# Patient Record
Sex: Female | Born: 1980 | Race: Black or African American | Hispanic: No | Marital: Single
Health system: Southern US, Community
[De-identification: ages and names within clinical notes are randomized; demographics above are authoritative.]

## PROBLEM LIST (undated history)

## (undated) DIAGNOSIS — J45909 Unspecified asthma, uncomplicated: Secondary | ICD-10-CM

## (undated) DIAGNOSIS — I10 Essential (primary) hypertension: Secondary | ICD-10-CM

## (undated) HISTORY — PX: ABDOMINAL SURGERY: SHX537

---

## 2018-02-01 ENCOUNTER — Emergency Department (HOSPITAL_COMMUNITY): Payer: Self-pay

## 2018-02-01 ENCOUNTER — Other Ambulatory Visit: Payer: Self-pay

## 2018-02-01 ENCOUNTER — Encounter (HOSPITAL_COMMUNITY): Payer: Self-pay

## 2018-02-01 ENCOUNTER — Emergency Department (HOSPITAL_COMMUNITY)
Admission: EM | Admit: 2018-02-01 | Discharge: 2018-02-01 | Disposition: A | Discharge status: Home / Self Care | Payer: Self-pay | Attending: Emergency Medicine | Admitting: Emergency Medicine

## 2018-02-01 DIAGNOSIS — I10 Essential (primary) hypertension: Secondary | ICD-10-CM | POA: Insufficient documentation

## 2018-02-01 DIAGNOSIS — G43809 Other migraine, not intractable, without status migrainosus: Secondary | ICD-10-CM | POA: Insufficient documentation

## 2018-02-01 DIAGNOSIS — R200 Anesthesia of skin: Secondary | ICD-10-CM | POA: Insufficient documentation

## 2018-02-01 DIAGNOSIS — R42 Dizziness and giddiness: Secondary | ICD-10-CM | POA: Insufficient documentation

## 2018-02-01 HISTORY — DX: Essential (primary) hypertension: I10

## 2018-02-01 HISTORY — DX: Unspecified asthma, uncomplicated: J45.909

## 2018-02-01 LAB — COMPREHENSIVE METABOLIC PANEL
ALT: 11 U/L (ref 0–44)
AST: 18 U/L (ref 15–41)
Albumin: 3.3 g/dL — ABNORMAL LOW (ref 3.5–5.0)
Alkaline Phosphatase: 75 U/L (ref 38–126)
Anion gap: 10 (ref 5–15)
BUN: 11 mg/dL (ref 6–20)
CO2: 21 mmol/L — AB (ref 22–32)
Calcium: 8.8 mg/dL — ABNORMAL LOW (ref 8.9–10.3)
Chloride: 107 mmol/L (ref 98–111)
Creatinine, Ser: 0.76 mg/dL (ref 0.44–1.00)
GFR calc Af Amer: >60 mL/min (ref >60)
GFR calc non Af Amer: >60 mL/min (ref >60)
Glucose, Bld: 124 mg/dL — ABNORMAL HIGH (ref 70–99)
POTASSIUM: 3.4 mmol/L — AB (ref 3.5–5.1)
Sodium: 138 mmol/L (ref 135–145)
Total Bilirubin: 0.4 mg/dL (ref 0.3–1.2)
Total Protein: 7.1 g/dL (ref 6.5–8.1)

## 2018-02-01 LAB — DIFFERENTIAL
ABS IMMATURE GRANULOCYTES: 0.03 10*3/uL (ref 0.00–0.07)
Basophils Absolute: 0.1 10*3/uL (ref 0.0–0.1)
Basophils Relative: 1 %
Eosinophils Absolute: 0.4 10*3/uL (ref 0.0–0.5)
Eosinophils Relative: 5 %
IMMATURE GRANULOCYTES: 0 %
Lymphocytes Relative: 41 %
Lymphs Abs: 3.4 10*3/uL (ref 0.7–4.0)
Monocytes Absolute: 0.4 10*3/uL (ref 0.1–1.0)
Monocytes Relative: 5 %
Neutro Abs: 4 10*3/uL (ref 1.7–7.7)
Neutrophils Relative %: 48 %

## 2018-02-01 LAB — CBC
HCT: 37.1 % (ref 36.0–46.0)
Hemoglobin: 11.4 g/dL — ABNORMAL LOW (ref 12.0–15.0)
MCH: 27.7 pg (ref 26.0–34.0)
MCHC: 30.7 g/dL (ref 30.0–36.0)
MCV: 90 fL (ref 80.0–100.0)
NRBC: 0 % (ref 0.0–0.2)
Platelets: 295 10*3/uL (ref 150–400)
RBC: 4.12 MIL/uL (ref 3.87–5.11)
RDW: 15.1 % (ref 11.5–15.5)
WBC: 8.3 10*3/uL (ref 4.0–10.5)

## 2018-02-01 LAB — I-STAT BETA HCG BLOOD, ED (MC, WL, AP ONLY)

## 2018-02-01 LAB — PROTIME-INR
INR: 0.95
Prothrombin Time: 12.6 seconds (ref 11.4–15.2)

## 2018-02-01 LAB — APTT: aPTT: 32 seconds (ref 24–36)

## 2018-02-01 LAB — I-STAT TROPONIN, ED: Troponin i, poc: 0 ng/mL (ref 0.00–0.08)

## 2018-02-01 LAB — CBG MONITORING, ED: Glucose-Capillary: 117 mg/dL — ABNORMAL HIGH (ref 70–99)

## 2018-02-01 MED ORDER — DEXAMETHASONE SODIUM PHOSPHATE 10 MG/ML IJ SOLN
10.0000 mg | Freq: Once | INTRAMUSCULAR | Status: AC
  Administered 2018-02-01: 10 mg via INTRAVENOUS
  Filled 2018-02-01: qty 1

## 2018-02-01 MED ORDER — DIPHENHYDRAMINE HCL 25 MG PO CAPS
25.0000 mg | ORAL_CAPSULE | Freq: Once | ORAL | Status: AC
  Administered 2018-02-01: 25 mg via ORAL
  Filled 2018-02-01: qty 1

## 2018-02-01 MED ORDER — LACTATED RINGERS IV BOLUS
1000.0000 mL | Freq: Once | INTRAVENOUS | Status: AC
  Administered 2018-02-01: 1000 mL via INTRAVENOUS

## 2018-02-01 MED ORDER — PROCHLORPERAZINE EDISYLATE 10 MG/2ML IJ SOLN
10.0000 mg | Freq: Once | INTRAMUSCULAR | Status: AC
  Administered 2018-02-01: 10 mg via INTRAVENOUS
  Filled 2018-02-01: qty 2

## 2018-02-01 NOTE — ED Notes (Signed)
E-signature not available, pt verbalized understanding of DC instructions  

## 2018-02-01 NOTE — ED Triage Notes (Signed)
Pt c.o headache, left sided tingling and chest pain around 5pm yesterday. Pt endorsing blurred vision. Denies CP at this time.

## 2018-02-01 NOTE — ED Provider Notes (Signed)
[facility] Provider Note   CSN: [id number] Arrival date & time: 02/01/18  1829     History   Chief Complaint Chief Complaint  Patient presents with  . Headache    HPI Susan Richards is a 37 y.o. female.  The history is provided by the patient.  Headache   This is a new problem. The current episode started yesterday. The problem occurs hourly. The problem has been gradually worsening. The headache is associated with emotional stress (poor sleep). The pain is located in the left unilateral region. The quality of the pain is described as dull. The pain is at a severity of 7/10. The pain is moderate. The pain radiates to the left arm. Associated symptoms include chest pressure (briefly last night but resolve, no chest pain now). Pertinent negatives include no anorexia, no fever, no malaise/fatigue, no near-syncope, no orthopnea, no palpitations, no syncope, no shortness of breath, no nausea and no vomiting. She has tried NSAIDs for the symptoms. The treatment provided mild relief.    Past Medical History:  Diagnosis Date  . Asthma   . Hypertension     There are no active problems to display for this patient.   Past Surgical History:  Procedure Laterality Date  . ABDOMINAL SURGERY    . CESAREAN SECTION  2006     OB History   No obstetric history on file.      Home Medications    Prior to Admission medications   Medication Sig Start Date End Date Taking? Authorizing Provider  ibuprofen (ADVIL,MOTRIN) 200 MG tablet Take 800 mg by mouth every 6 (six) hours as needed for headache or mild pain.   Yes [provider]    Family History No family history on file.  Social History Social History   Tobacco Use  . Smoking status: Not on file  Substance Use Topics  . Alcohol use: Not on file  . Drug use: Not on file     Allergies   Chocolate   Review of Systems Review of Systems  Constitutional: Negative for  chills, fever and malaise/fatigue.  HENT: Negative for ear pain and sore throat.   Eyes: Negative for photophobia, pain, discharge, redness, itching and visual disturbance.  Respiratory: Negative for cough and shortness of breath.   Cardiovascular: Negative for chest pain, palpitations, orthopnea, syncope and near-syncope.  Gastrointestinal: Negative for abdominal pain, anorexia, nausea and vomiting.  Genitourinary: Negative for dysuria and hematuria.  Musculoskeletal: Negative for arthralgias and back pain.  Skin: Negative for color change and rash.  Neurological: Positive for dizziness, numbness and headaches. Negative for tremors, seizures, syncope, speech difficulty, weakness and light-headedness.  All other systems reviewed and are negative.    Physical Exam Updated Vital Signs  ED Triage Vitals  Enc Vitals Group     BP 02/01/18 1844 (!) 181/99     Pulse Rate 02/01/18 1844 75     Resp 02/01/18 1844 16     Temp 02/01/18 1844 98.3 F (36.8 C)     Temp Source 02/01/18 1844 Oral     SpO2 02/01/18 1844 97 %     Weight --      Height --      Head Circumference --      Peak Flow --      Pain Score 02/01/18 1850 10     Pain Loc --      Pain Edu? --      Excl. in GC? --  Physical Exam Vitals signs and nursing note reviewed.  Constitutional:      General: She is not in acute distress.    Appearance: She is well-developed.  HENT:     Head: Normocephalic and atraumatic.     Mouth/Throat:     Mouth: Mucous membranes are moist.     Pharynx: Oropharynx is clear.  Eyes:     General: No visual field deficit.    Extraocular Movements: Extraocular movements intact.     Right eye: Normal extraocular motion and no nystagmus.     Left eye: Normal extraocular motion and no nystagmus.     Conjunctiva/sclera: Conjunctivae normal.     Pupils: Pupils are equal, round, and reactive to light.     Right eye: Pupil is round and reactive.     Left eye: Pupil is round and reactive.    Neck:     Musculoskeletal: Normal range of motion and neck supple. No neck rigidity.     Meningeal: Brudzinski's sign absent.  Cardiovascular:     Rate and Rhythm: Normal rate and regular rhythm.     Heart sounds: Normal heart sounds. No murmur.  Pulmonary:     Effort: Pulmonary effort is normal. No respiratory distress.     Breath sounds: Normal breath sounds.  Abdominal:     General: Bowel sounds are normal.     Palpations: Abdomen is soft.     Tenderness: There is no abdominal tenderness.  Musculoskeletal: Normal range of motion.  Lymphadenopathy:     Cervical: No cervical adenopathy.  Skin:    General: Skin is warm and dry.  Neurological:     Mental Status: She is alert and oriented to person, place, and time.     Cranial Nerves: No cranial nerve deficit, dysarthria or facial asymmetry.     Sensory: No sensory deficit.     Coordination: Coordination normal.     Gait: Gait normal.     Comments: 5+/5 strength throughout, normal sensation, no drift, normal finger to nose finger, normal gait  Psychiatric:        Mood and Affect: Mood normal.      ED Treatments / Results  Labs (all labs ordered are listed, but only abnormal results are displayed) Labs Reviewed  CBC - Abnormal; Notable for the following components:      Result Value   Hemoglobin 11.4 (*)    All other components within normal limits  COMPREHENSIVE METABOLIC PANEL - Abnormal; Notable for the following components:   Potassium 3.4 (*)    CO2 21 (*)    Glucose, Bld 124 (*)    Calcium 8.8 (*)    Albumin 3.3 (*)    All other components within normal limits  CBG MONITORING, ED - Abnormal; Notable for the following components:   Glucose-Capillary 117 (*)    All other components within normal limits  PROTIME-INR  APTT  DIFFERENTIAL  I-STAT TROPONIN, ED  I-STAT BETA HCG BLOOD, ED ([facility], [facility], [facility] ONLY)    EKG EKG Interpretation  Date/Time:  Tuesday February 01 2018 18:58:17 EST Ventricular Rate:  87 PR  Interval:  204 QRS Duration: 74 QT Interval:  358 QTC Calculation: 430 R Axis:   35 Text Interpretation:  Normal sinus rhythm T wave abnormality, consider lateral ischemia Abnormal ECG no prior to compare to Confirmed by Virgina Norfolk [phone]) on 02/01/2018 7:09:32 PM   Radiology Dg Chest 2 View  Result Date: 02/01/2018 CLINICAL DATA:  Chest pain for 1 day EXAM:  CHEST - 2 VIEW COMPARISON:  None. FINDINGS: The heart size and mediastinal contours are within normal limits. Both lungs are clear. The visualized skeletal structures are unremarkable. IMPRESSION: No active cardiopulmonary disease. Electronically Signed   By: Alcide Clever M.D.   On: 02/01/2018 20:39   Ct Head Wo Contrast  Result Date: 02/01/2018 CLINICAL DATA:  Headache for 2 days. EXAM: CT HEAD WITHOUT CONTRAST TECHNIQUE: Contiguous axial images were obtained from the base of the skull through the vertex without intravenous contrast. COMPARISON:  None. FINDINGS: Brain: The ventricles are normal in size and configuration. No extra-axial fluid collections are identified. The gray-white differentiation is maintained. No CT findings for acute hemispheric infarction or intracranial hemorrhage. No mass lesions. The brainstem and cerebellum are normal. Vascular: No hyperdense vessels or obvious aneurysm. Skull: No acute skull fracture.  No bone lesion. Sinuses/Orbits: The paranasal sinuses and mastoid air cells are clear. The globes are intact. Other: No scalp lesions, laceration or hematoma. IMPRESSION: Normal head CT. Electronically Signed   By: Rudie Meyer M.D.   On: 02/01/2018 20:11    Procedures Procedures (including critical care time)  Medications Ordered in ED Medications  prochlorperazine (COMPAZINE) injection 10 mg (10 mg Intravenous Given 02/01/18 1943)  diphenhydrAMINE (BENADRYL) capsule 25 mg (25 mg Oral Given 02/01/18 1937)  dexamethasone (DECADRON) injection 10 mg (10 mg Intravenous Given 02/01/18 1946)  lactated  ringers bolus 1,000 mL (1,000 mLs Intravenous New Bag/Given 02/01/18 1947)     Initial Impression / Assessment and Plan / ED Course  I have reviewed the triage vital signs and the nursing notes.  Pertinent labs & imaging results that were available during my care of the patient were reviewed by me and considered in my medical decision making (see chart for details).     Susan Richards is a 37 year old female with history of hypertension no longer medication who presents to the ED with headache.  Patient with normal vitals.  No fever.  Patient with gradual headache that started yesterday.  Mostly left-sided.  Has history of headaches about once a month.  Headache is slightly worse today than normal.  She had some dizziness, nausea, chest pressure yesterday that has now resolved.  She states some paresthesias down her left arm but that has now resolved.  She felt like her vision was possibly off as well but denies any vision changes at this time.  Patient currently with left-sided headache.  Denies any trauma.  States that she has not been sleeping well.  She is under a lot of stress at home and at work.  Patient is neurologically intact on exam.  Normal strength, normal sensation.  No signs to suggest a stroke.  She has 20/20 vision in each eye.  No visual field deficit.  Clear breath sounds on exam.  Labs and imaging ordered prior to my evaluation.  Will treat headache with IV Compazine, IV Benadryl, IV Decadron.  Will give lactated Ringer bolus and reevaluate.  Suspect likely complex migraine.  Will get head imaging to rule out any intracranial process.  No concern for stroke.  EKG shows sinus rhythm.  Patient has some T wave inversions but no prior EKG to compare to.  Troponin within normal limits.  No chest pain.  Doubt cardiac process.  Patient with negative pregnancy test.  No significant anemia, electrolyte abnormality, kidney injury.  Chest x-ray showed no signs of pneumonia, pneumothorax, pleural  effusion.  CT of the head showed no acute findings.  Signs of  mass-effect.  Overall patient with unremarkable work-up.  Upon reevaluation headache has resolved.  Patient given information to follow-up with primary care, neurology.  Given return precautions and discharged in ED in good condition.  This chart was dictated using voice recognition software.  Despite best efforts to proofread,  errors can occur which can change the documentation meaning.   Final Clinical Impressions(s) / ED Diagnoses   Final diagnoses:  Other migraine without status migrainosus, not intractable    ED Discharge Orders    None       Virgina Norfolk, DO 02/01/18 2216

## 2020-07-03 IMAGING — CT CT HEAD W/O CM
3 series · 15 of 47 positions shown, 18 images · non-contrast
Comparison: None.

CLINICAL DATA: Headache for 2 days.

EXAM:
CT HEAD WITHOUT CONTRAST
TECHNIQUE: Contiguous axial images were obtained from the base of the skull
through the vertex without intravenous contrast.

[Series 3: head 5.0 h30s · axial · 0.40mm/px · z∈[+1266,+1391]mm · 9 of 30 slices shown, 12 images]
[im 3/30  brain]
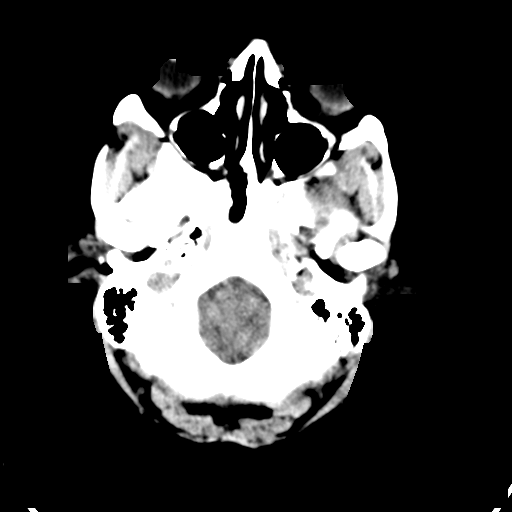
[im 3/30  bone]
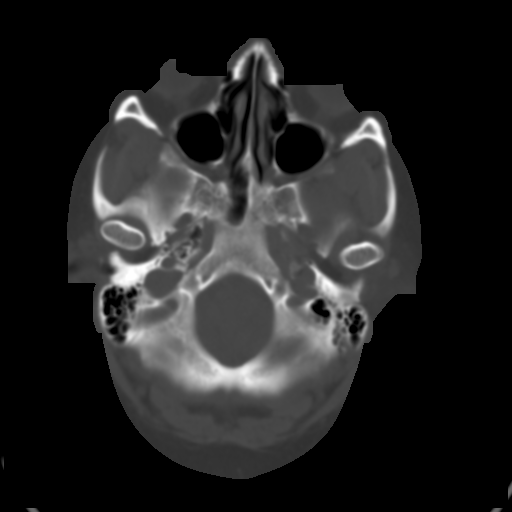
[im 6/30  brain]
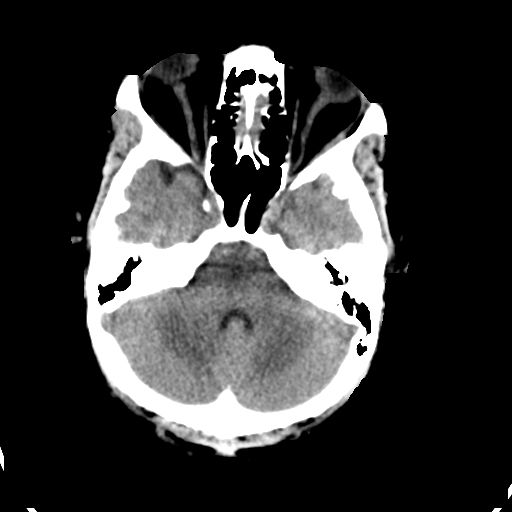
[im 9/30  brain]
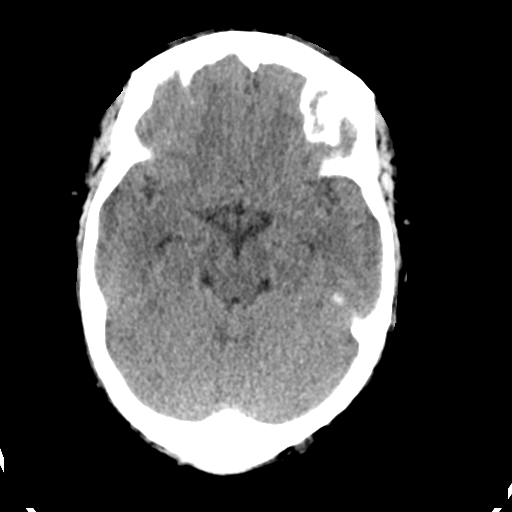
[im 12/30  brain]
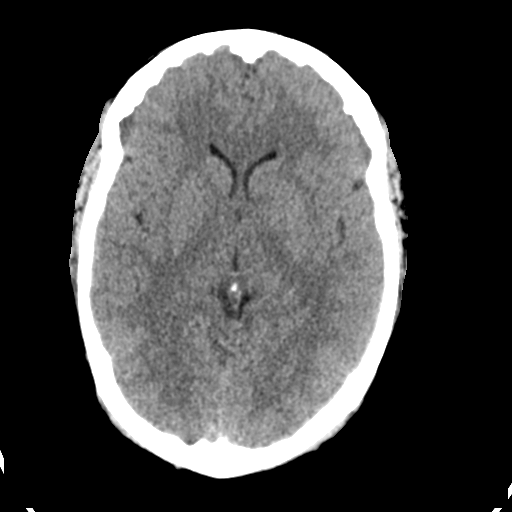
[im 16/30  brain]
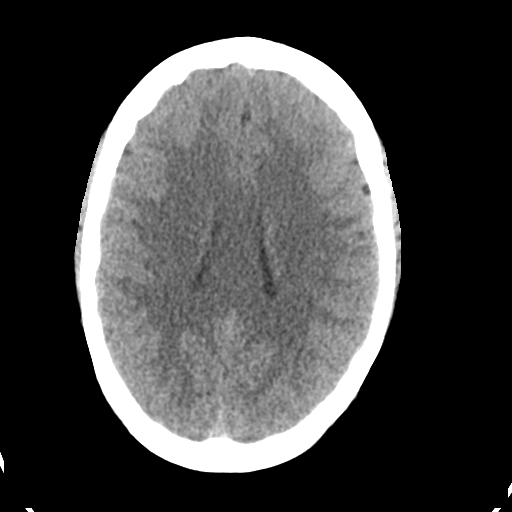
[im 16/30  bone]
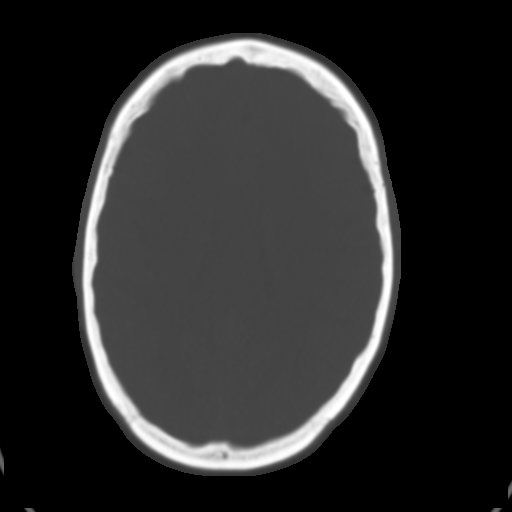
[im 19/30  brain]
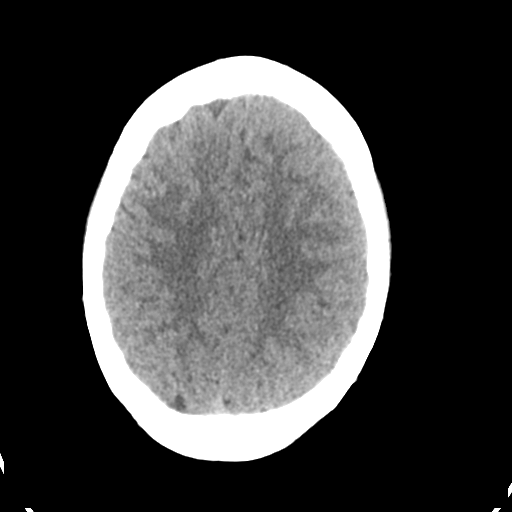
[im 22/30  brain]
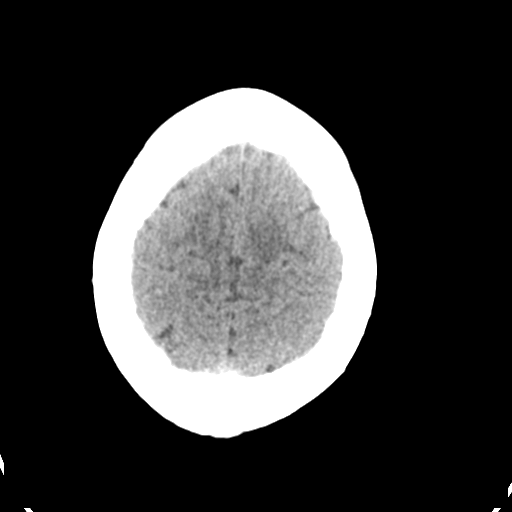
[im 25/30  brain]
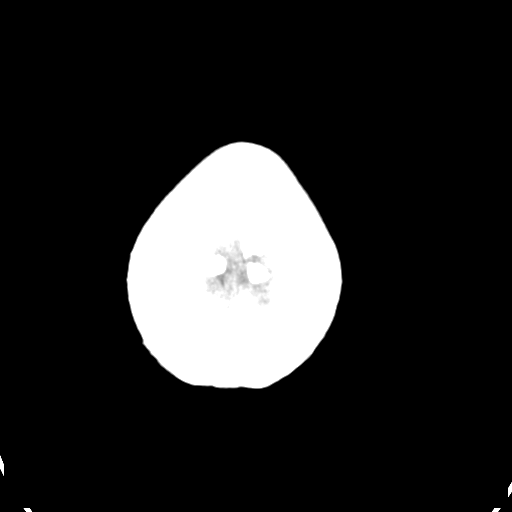
[im 28/30  brain]
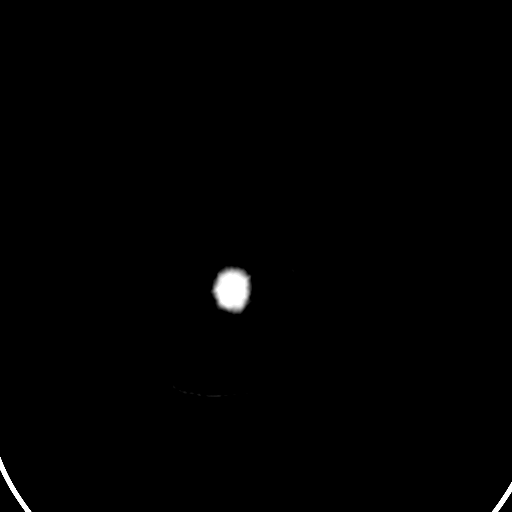
[im 28/30  bone]
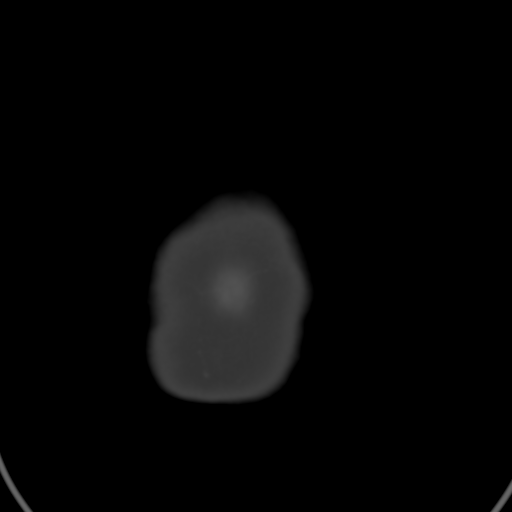

[Series 5: head 3.0 mpr cor · coronal · 0.29mm/px · 3 of 67 slices shown]
[im 23/67  brain]
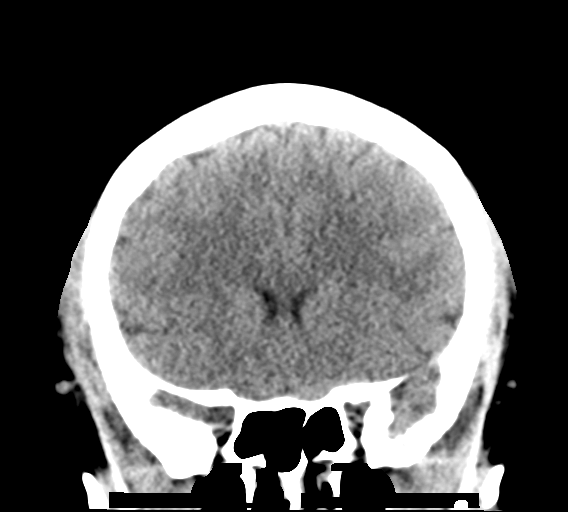
[im 30/67  brain]
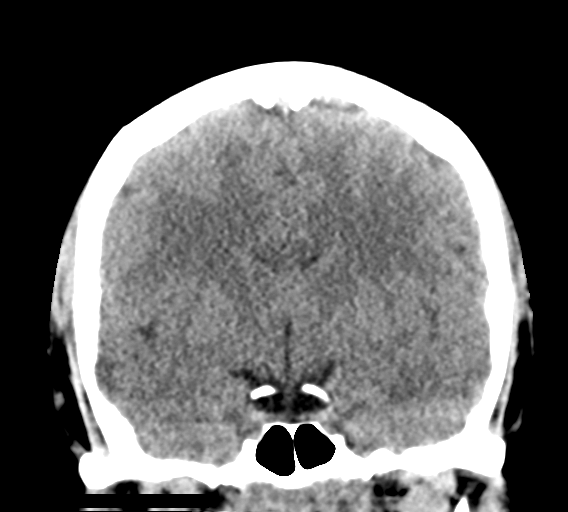
[im 37/67  brain]
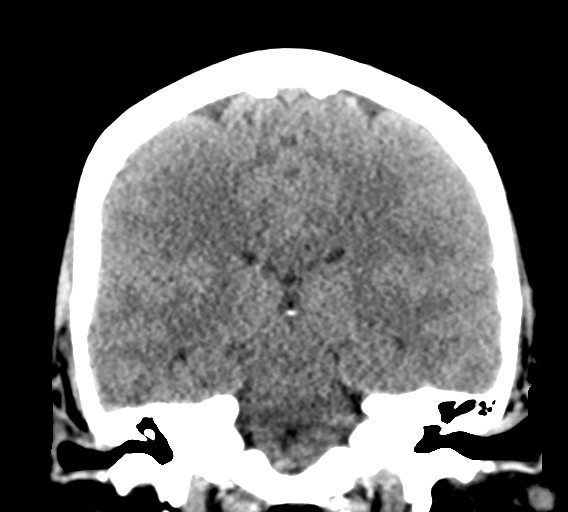

[Series 6: head 3.0 mpr sag · sagittal · 0.29mm/px · 3 of 54 slices shown]
[im 18/54  brain]
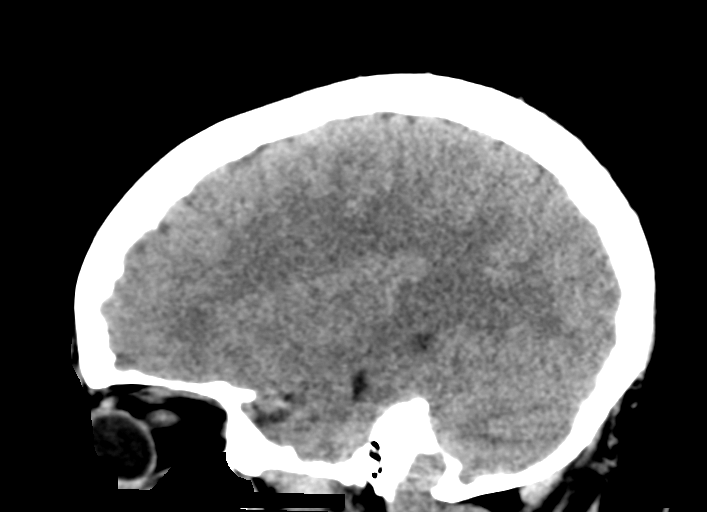
[im 27/54  brain]
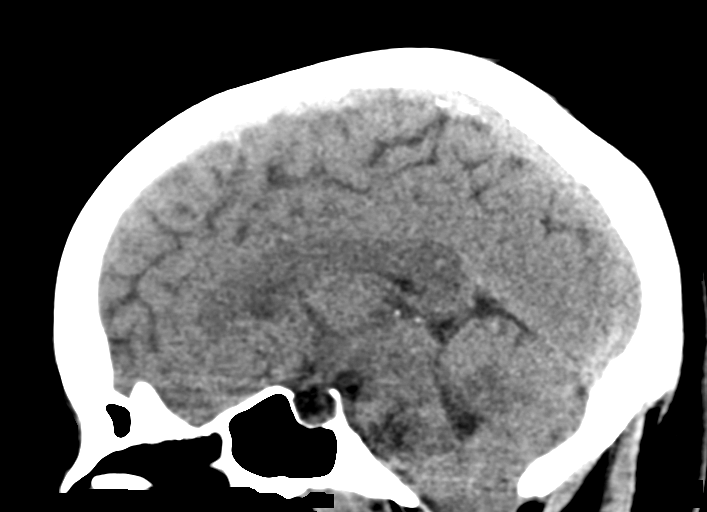
[im 36/54  brain]
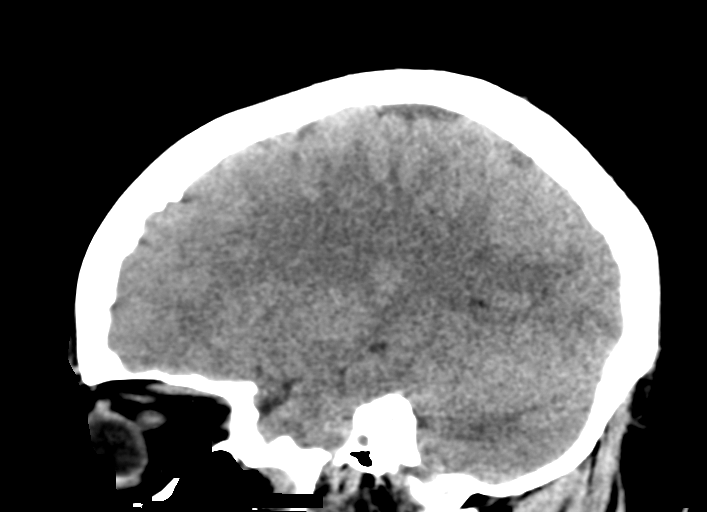

[15 of 47 positions shown; findings below may reference images not displayed]

FINDINGS: Brain: The ventricles are normal in size and configuration. No
extra-axial fluid collections are identified. The gray-white
differentiation is maintained. No CT findings for acute hemispheric
infarction or intracranial hemorrhage. No mass lesions. The
brainstem and cerebellum are normal.

Vascular: No hyperdense vessels or obvious aneurysm.

Skull: No acute skull fracture.  No bone lesion.

Sinuses/Orbits: The paranasal sinuses and mastoid air cells are
clear. The globes are intact.

Other: No scalp lesions, laceration or hematoma.
IMPRESSION: Normal head CT.
